# Patient Record
Sex: Female | Born: 1975 | Race: White | Hispanic: No | Marital: Married | Smoking: Never smoker
Health system: Southern US, Community
[De-identification: ages and names within clinical notes are randomized; demographics above are authoritative.]

## PROBLEM LIST (undated history)

## (undated) DIAGNOSIS — N979 Female infertility, unspecified: Secondary | ICD-10-CM

## (undated) DIAGNOSIS — N809 Endometriosis, unspecified: Secondary | ICD-10-CM

## (undated) DIAGNOSIS — M797 Fibromyalgia: Secondary | ICD-10-CM

## (undated) HISTORY — PX: WISDOM TOOTH EXTRACTION: SHX21

## (undated) HISTORY — PX: LAPAROSCOPY: SHX197

## (undated) HISTORY — DX: Endometriosis, unspecified: N80.9

## (undated) HISTORY — DX: Fibromyalgia: M79.7

## (undated) HISTORY — DX: Female infertility, unspecified: N97.9

---

## 1999-10-18 ENCOUNTER — Other Ambulatory Visit: Admission: RE | Admit: 1999-10-18 | Discharge: 1999-10-18 | Payer: Self-pay | Admitting: Gynecology

## 2000-04-24 ENCOUNTER — Ambulatory Visit (HOSPITAL_COMMUNITY): Admission: RE | Admit: 2000-04-24 | Discharge: 2000-04-24 | Payer: Self-pay | Admitting: Obstetrics and Gynecology

## 2000-04-24 ENCOUNTER — Encounter: Payer: Self-pay | Admitting: Obstetrics and Gynecology

## 2000-08-11 ENCOUNTER — Inpatient Hospital Stay (HOSPITAL_COMMUNITY): Admission: AD | Admit: 2000-08-11 | Discharge: 2000-08-11 | Payer: Self-pay | Admitting: Obstetrics and Gynecology

## 2000-09-24 ENCOUNTER — Inpatient Hospital Stay (HOSPITAL_COMMUNITY): Admission: AD | Admit: 2000-09-24 | Discharge: 2000-09-27 | Payer: Self-pay | Admitting: Obstetrics and Gynecology

## 2000-11-28 ENCOUNTER — Other Ambulatory Visit: Admission: RE | Admit: 2000-11-28 | Discharge: 2000-11-28 | Payer: Self-pay | Admitting: Gynecology

## 2002-07-16 ENCOUNTER — Other Ambulatory Visit: Admission: RE | Admit: 2002-07-16 | Discharge: 2002-07-16 | Payer: Self-pay | Admitting: Obstetrics and Gynecology

## 2003-07-21 ENCOUNTER — Other Ambulatory Visit: Admission: RE | Admit: 2003-07-21 | Discharge: 2003-07-21 | Payer: Self-pay | Admitting: Obstetrics and Gynecology

## 2007-02-12 ENCOUNTER — Ambulatory Visit (HOSPITAL_COMMUNITY): Admission: RE | Admit: 2007-02-12 | Discharge: 2007-02-12 | Payer: Self-pay | Admitting: Obstetrics and Gynecology

## 2008-05-15 ENCOUNTER — Encounter: Admission: RE | Admit: 2008-05-15 | Discharge: 2008-05-15 | Payer: Self-pay | Admitting: Otolaryngology

## 2008-08-01 ENCOUNTER — Ambulatory Visit (HOSPITAL_COMMUNITY): Admission: RE | Admit: 2008-08-01 | Discharge: 2008-08-01 | Payer: Self-pay | Admitting: Otolaryngology

## 2008-08-01 ENCOUNTER — Encounter (INDEPENDENT_AMBULATORY_CARE_PROVIDER_SITE_OTHER): Payer: Self-pay | Admitting: Otolaryngology

## 2010-09-21 LAB — BASIC METABOLIC PANEL
BUN: 10 mg/dL (ref 6–23)
CO2: 26 mEq/L (ref 19–32)
Calcium: 9.7 mg/dL (ref 8.4–10.5)
Chloride: 108 mEq/L (ref 96–112)
Creatinine, Ser: 0.77 mg/dL (ref 0.4–1.2)
GFR calc Af Amer: >60 mL/min (ref >60)
GFR calc non Af Amer: >60 mL/min (ref >60)
Glucose, Bld: 89 mg/dL (ref 70–99)
Potassium: 4.7 mEq/L (ref 3.5–5.1)
Sodium: 138 mEq/L (ref 135–145)

## 2010-09-21 LAB — CBC
HCT: 38.3 % (ref 36.0–46.0)
Hemoglobin: 13.6 g/dL (ref 12.0–15.0)
MCHC: 35.5 g/dL (ref 30.0–36.0)
MCV: 90.8 fL (ref 78.0–100.0)
Platelets: 221 10*3/uL (ref 150–400)
RBC: 4.22 MIL/uL (ref 3.87–5.11)
RDW: 12.9 % (ref 11.5–15.5)
WBC: 4.5 10*3/uL (ref 4.0–10.5)

## 2010-09-21 LAB — HCG, SERUM, QUALITATIVE: Preg, Serum: NEGATIVE

## 2010-10-19 NOTE — Op Note (Signed)
Samantha Barron, Samantha Barron                 ACCOUNT NO.:  [id number]   MEDICAL RECORD NO.:  [id number]          PATIENT TYPE:  AMB   LOCATION:  SDS                          FACILITY:  [facility]   PHYSICIAN:  Kinnie Scales. Annalee Genta, M.D.DATE OF BIRTH:  1975/10/27   DATE OF PROCEDURE:  08/01/2008  DATE OF DISCHARGE:  08/01/2008                               OPERATIVE REPORT   PREOPERATIVE DIAGNOSIS:  Bilateral preauricular fistulae.   POSTOPERATIVE DIAGNOSIS:  Bilateral preauricular fistulae.   INDICATIONS FOR SURGERY:  Bilateral preauricular fistulae.   SURGICAL PROCEDURES:  Exploration, excision, and reconstruction of  bilateral preauricular fistulae.   SURGEON:  Kinnie Scales. Annalee Genta, MD   ANESTHESIA:  General.   COMPLICATIONS:  None.   BLOOD LOSS:  Minimal.   The patient is transferred from the operating room to the recovery room  in stable condition.   BRIEF HISTORY:  The patient is a 35 year old white female, who has had a  history of congenital bilateral preauricular pits.  On the patient's  left-hand side, she has had chronic symptoms of drainage and over the  last 6 months, she has had increasing size of the underlying skin mass  with pain and erythema.  She was seen in our office and treated for  acute infection with several courses of antibiotics.  A CT scan was  obtained of the facial soft tissue and temporal bones, and the patient  had normal-appearing middle ear and ear canal development.  Examination  was normal with the exception on the left-hand side, a small skin pit  with approximately 7-mm firm mass in the deep subcutaneous tissue  anterior to the preauricular sulcus.  On the patient's right-hand side,  there was a small skin pit, but no palpable mass and no drainage or  discharge.  Given the patient's history and physical examination and CT  findings, I recommended to undertake excisional biopsy of the cysts  bilaterally.  The risks, benefits, and possible complications  of  procedure were discussed in detail, and the patient understood and  concurred with our plan for surgery.   PROCEDURE:  The patient was brought to the operating room at [facility] OR on August 01, 2008, and placed in supine position on  the operating table.  General LMA anesthesia was established without  difficulty.  When the patient was adequately anesthetized, she was  positioned on the operating table, and prepped and draped in sterile  fashion.  The proposed skin incisions were injected with a total of 3 mL  of 1% lidocaine with 1:100,000 solution epinephrine injected in  subcutaneous fashion in the preauricular crease bilaterally.  After  allowing adequate time for vasoconstriction hemostasis, the procedure  was begun on the patient's left-hand side.  She was prepped and draped.  A 3-cm curvilinear incision was created in the preauricular crease.  This was an elliptical incision, which included the skin pit and  surrounding soft tissues.  Incision was created with a #15 scalpel and  the skin was divided.  The central component including the skin pit was  then excised and  tracked medially.  The preauricular cartilages were  carefully dissected and preserved.  Soft tissue was reflected  anteriorly.  A cystic mass was identified and then tracked from  superficial to deep, total dissection measured approximately 1.5 cm and  a large approximately 1 cm cyst was resected from the preauricular  sulcus.  There was no evidence of communication with the ear canal and  the entire soft tissue cyst was resected without evidence of spillage of  the cyst contents.  The left preauricular cyst was then sent to  pathology for gross microscopic evaluation.  The wound was thoroughly  irrigated and then closed in multiple layers, deep soft tissue  approximation with 5-0 Vicryl suture, soft tissue was advanced and  rotated into position, and the superficial and subcutaneous layers  were  closed with interrupted 5-0 Vicryl.  Final skin closure with 6-0  Ethilon, and the wound was then dressed with bacitracin ointment.   On the right-hand side, procedure was then carried out, which was  similar in nature.  She was positioned, prepped and draped in a sterile  fashion, and an elliptical incision was created around the right skin  pit.  Although there was no significant palpable mass superficially,  dissection was carried out and extended approximately 2 cm in length  from the skin along the superior aspect of the ear canal.  There was no  evidence of direct connection with the ear canal, but a large thin  cystic mass was dissected carefully of the surrounding tissues.  At the  conclusion of procedure, the area was carefully palpated and there was  no evidence of residual cyst or a cystic material.  All tissue was sent  to Pathology for microscopic evaluation.  The wound was then irrigated  and closed in multiple layers beginning with deep reconstruction with a  5-0 Vicryl suture.  Soft tissue anteriorly was then rotated into  position and closed with multiple 5-0 Vicryl sutures.  Deep subcutaneous  tissue was closed with the same stitch and final skin edge closed with 6-  0 Ethilon in an interrupted fashion.  The patient's wound was then  dressed with bacitracin ointment.  She was awakened from anesthetic and  transferred from the operating room to recovery room in stable  condition.  No complications.  Blood loss minimal.           ______________________________  Kinnie Scales. Annalee Genta, M.D.     DLS/MEDQ  D:  81/19/1478  T:  08/01/2008  Job:  [id number]

## 2010-10-19 NOTE — Op Note (Signed)
NAMEJACELYNN, Samantha Barron                 ACCOUNT NO.:  [id number]   MEDICAL RECORD NO.:  [id number]          PATIENT TYPE:  AMB   LOCATION:  SDC                           FACILITY:  [facility]   PHYSICIAN:  Dois Davenport A. Rivard, M.D. DATE OF BIRTH:  03-24-1976   DATE OF PROCEDURE:  02/12/2007  DATE OF DISCHARGE:                               OPERATIVE REPORT   PREOPERATIVE DIAGNOSIS:  Dyspareunia with perineal band   POSTOPERATIVE DIAGNOSIS:  Dyspareunia with perineal band   ANESTHESIA:  IV sedation and local.   PROCEDURE:  Perineoplasty.   SURGEON:  Crist Fat. Rivard, M.D.   ASSISTANTMarquis Lunch. Lowell Guitar, P.A.   DESCRIPTION OF PROCEDURE:  After being informed of the planned procedure  with possible complications; including bleeding, infection, and scar  tissue informed consent is obtained.  The patient is taken to OR #8 and  given IV sedation and placed in the lithotomy position.  She is prepped  and draped in a sterile fashion and her bladder is emptied with an in-  and-out Foley catheter.   GYN exam reveals a perineal band from a previous site of perineal tear  as well as part of the high mantle band being sutured to the vestibule.  The patient is very comfortable under IV sedation but we performed local  infiltration of the perineum using 7 mL of Marcaine 0.25.   We initially proceeded with excision of that high mantle remnant using  sharp scissors.  Then the perineal skin is incised vertically,  undermined until it is easily mobilized without affecting the perineal  muscle; and we then perform paradoxical closure with 4-0 Monocryl after  assessing good hemostasis with cauterization.  At the end of the  procedure the perineum has a normal elliptical shape.  No bandwidth  traction and, of course, the high mantle remnant is now removed.   Instruments and sponge count is complete x2.  Estimated blood loss is  minimal.  The procedure is very well tolerated by the patient who is  taken to  recovery room, and then discharged home in a well and stable  condition.      Crist Fat Rivard, M.D.  Electronically Signed     SAR/MEDQ  D:  02/12/2007  T:  02/13/2007  Job:  [id number]
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
## 2011-03-18 LAB — CBC
HCT: 39.1
Hemoglobin: 13.8
MCV: 91.2
Platelets: 236
RDW: 12.6

## 2011-10-28 ENCOUNTER — Emergency Department (HOSPITAL_COMMUNITY): Payer: No Typology Code available for payment source

## 2011-10-28 ENCOUNTER — Encounter (HOSPITAL_COMMUNITY): Payer: Self-pay | Admitting: *Deleted

## 2011-10-28 ENCOUNTER — Emergency Department (HOSPITAL_COMMUNITY)
Admission: EM | Admit: 2011-10-28 | Discharge: 2011-10-28 | Disposition: A | Discharge status: Home / Self Care | Payer: No Typology Code available for payment source | Attending: Emergency Medicine | Admitting: Emergency Medicine

## 2011-10-28 DIAGNOSIS — Y92410 Unspecified street and highway as the place of occurrence of the external cause: Secondary | ICD-10-CM | POA: Insufficient documentation

## 2011-10-28 DIAGNOSIS — IMO0002 Reserved for concepts with insufficient information to code with codable children: Secondary | ICD-10-CM | POA: Insufficient documentation

## 2011-10-28 DIAGNOSIS — Y998 Other external cause status: Secondary | ICD-10-CM | POA: Insufficient documentation

## 2011-10-28 DIAGNOSIS — M542 Cervicalgia: Secondary | ICD-10-CM | POA: Insufficient documentation

## 2011-10-28 DIAGNOSIS — Z79899 Other long term (current) drug therapy: Secondary | ICD-10-CM | POA: Insufficient documentation

## 2011-10-28 DIAGNOSIS — T148XXA Other injury of unspecified body region, initial encounter: Secondary | ICD-10-CM

## 2011-10-28 DIAGNOSIS — M546 Pain in thoracic spine: Secondary | ICD-10-CM | POA: Insufficient documentation

## 2011-10-28 DIAGNOSIS — R1032 Left lower quadrant pain: Secondary | ICD-10-CM | POA: Insufficient documentation

## 2011-10-28 DIAGNOSIS — M545 Low back pain, unspecified: Secondary | ICD-10-CM | POA: Insufficient documentation

## 2011-10-28 MED ORDER — ACETAMINOPHEN-CODEINE #3 300-30 MG PO TABS: 1.0000 | ORAL_TABLET | Freq: Four times a day (QID) | ORAL | Status: DC | PRN

## 2011-10-28 MED ORDER — ACETAMINOPHEN-CODEINE 120-12 MG/5ML PO SOLN
10.0000 mL | Freq: Once | ORAL | Status: AC
  Administered 2011-10-28: 6 mL via ORAL
  Filled 2011-10-28: qty 10

## 2011-10-28 MED ORDER — CYCLOBENZAPRINE HCL 10 MG PO TABS: 10.0000 mg | ORAL_TABLET | Freq: Two times a day (BID) | ORAL | Status: AC | PRN

## 2011-10-28 MED ORDER — FENTANYL CITRATE 0.05 MG/ML IJ SOLN
12.5000 ug | Freq: Once | INTRAMUSCULAR | Status: DC
  Filled 2011-10-28: qty 2

## 2011-10-28 MED ORDER — CYCLOBENZAPRINE HCL 10 MG PO TABS: 10.0000 mg | ORAL_TABLET | Freq: Two times a day (BID) | ORAL | Status: DC | PRN

## 2011-10-28 MED ORDER — ACETAMINOPHEN-CODEINE 120-12 MG/5ML PO SOLN: 5.0000 mL | Freq: Four times a day (QID) | ORAL | Status: AC | PRN

## 2011-10-28 MED ORDER — IOHEXOL 300 MG/ML  SOLN
80.0000 mL | Freq: Once | INTRAMUSCULAR | Status: AC | PRN
  Administered 2011-10-28: 80 mL via INTRAVENOUS

## 2011-10-28 MED ORDER — ACETAMINOPHEN-CODEINE #3 300-30 MG PO TABS: 2.0000 | ORAL_TABLET | Freq: Once | ORAL | Status: DC
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
## 2011-10-28 NOTE — ED Notes (Signed)
Patient transported to X-ray 

## 2011-10-28 NOTE — ED Notes (Signed)
Pt returned from radiology.
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
## 2011-10-28 NOTE — ED Provider Notes (Signed)
Medical screening examination/treatment/procedure(s) were performed by non-physician practitioner and as supervising physician I was immediately available for consultation/collaboration.   Carleene Cooper III, MD 10/28/11 2059

## 2011-10-28 NOTE — ED Notes (Signed)
Patient states allergic to a lot of medications and asked the nurse to ask Doctor if medication is similar to any of the medication she is allergic to. Resting comfortably on stretcher currently does not want blanket placed blanket on patient's side if needed. Family member at beside.

## 2012-06-13 ENCOUNTER — Ambulatory Visit: Payer: Self-pay | Admitting: Obstetrics and Gynecology

## 2012-06-19 ENCOUNTER — Telehealth: Payer: Self-pay

## 2012-06-19 MED ORDER — NORETHINDRONE-ETH ESTRADIOL 0.4-35 MG-MCG PO TABS: 1.0000 | ORAL_TABLET | Freq: Every day | ORAL | Status: DC

## 2012-06-19 NOTE — Telephone Encounter (Signed)
Rx refill request Balziva 28 tablet #84 take 1 tab po daily continuously. [facility]  Advised pt that rx would be called into pharmacy to last until aex scheduled for 07/04/12 with SR  Darien Ramus, CMA

## 2012-07-04 ENCOUNTER — Ambulatory Visit: Payer: Managed Care, Other (non HMO) | Admitting: Obstetrics and Gynecology

## 2012-07-04 ENCOUNTER — Encounter: Payer: Self-pay | Admitting: Obstetrics and Gynecology

## 2012-07-04 VITALS — BP 102/60 | Ht 63.5 in | Wt 152.0 lb

## 2012-07-04 DIAGNOSIS — Z01419 Encounter for gynecological examination (general) (routine) without abnormal findings: Secondary | ICD-10-CM

## 2012-07-04 DIAGNOSIS — Z124 Encounter for screening for malignant neoplasm of cervix: Secondary | ICD-10-CM

## 2012-07-04 DIAGNOSIS — N979 Female infertility, unspecified: Secondary | ICD-10-CM | POA: Insufficient documentation

## 2012-07-04 DIAGNOSIS — M797 Fibromyalgia: Secondary | ICD-10-CM | POA: Insufficient documentation

## 2012-07-04 DIAGNOSIS — N809 Endometriosis, unspecified: Secondary | ICD-10-CM | POA: Insufficient documentation

## 2012-07-04 MED ORDER — NORETHINDRONE-ETH ESTRADIOL 0.4-35 MG-MCG PO TABS: 1.0000 | ORAL_TABLET | Freq: Every day | ORAL | Status: AC

## 2012-07-04 NOTE — Progress Notes (Signed)
The patient reports:no complaints   Contraception:BCP Ovcon 35  Last mammogram: n/a Last pap:06/06/2011 Normal  GC/Chlamydia cultures offered: declined HIV/RPR/HbsAg offered:  declined HSV 1 and 2 glycoprotein offered: declined  Menstrual cycle regular and monthly: No Menstrual flow normal: No:   Urinary symptoms: none Normal bowel movements: Yes Reports abuse at home: No:   Subjective:    Samantha Barron is a 37 y.o. female, G1P1, who presents for an annual exam.     History   Social History  . Marital Status: Married    Spouse Name: N/A    Number of Children: N/A  . Years of Education: N/A   Social History Main Topics  . Smoking status: Never Smoker   . Smokeless tobacco: None  . Alcohol Use: No  . Drug Use: No  . Sexually Active: Yes    Birth Control/ Protection: Pill   Other Topics Concern  . None   Social History Narrative  . None    Menstrual cycle:   LMP: No LMP recorded. Patient is not currently having periods (Reason: Other).           Cycle: normal  The following portions of the patient's history were reviewed and updated as appropriate: allergies, current medications, past family history, past medical history, past social history, past surgical history and problem list.  Review of Systems Pertinent items are noted in HPI. Breast:Negative for breast lump,nipple discharge or nipple retraction Gastrointestinal: Negative for abdominal pain, change in bowel habits or rectal bleeding Urinary:negative   Objective:    BP 102/60  Ht 5' 3.5" (1.613 m)  Wt 152 lb (68.947 kg)  BMI 26.50 kg/m2    Weight:  Wt Readings from Last 1 Encounters:  07/04/12 152 lb (68.947 kg)          BMI: Body mass index is 26.50 kg/(m^2).  General Appearance: Alert, appropriate appearance for age. No acute distress HEENT: Grossly normal Neck / Thyroid: Supple, no masses, nodes or enlargement Lungs: clear to auscultation bilaterally Back: No CVA tenderness Breast Exam: No  masses or nodes.No dimpling, nipple retraction or discharge. Cardiovascular: Regular rate and rhythm. S1, S2, no murmur Gastrointestinal: Soft, non-tender, no masses or organomegaly Pelvic Exam: Vulva and vagina appear normal. Bimanual exam reveals normal uterus and adnexa. Rectovaginal: not indicated Lymphatic Exam: Non-palpable nodes in neck, clavicular, axillary, or inguinal regions  Skin: no rash or abnormalities Neurologic: Normal gait and speech, no tremor  Psychiatric: Alert and oriented, appropriate affect.    Assessment:    Normal gyn exam    Plan:    pap smear return annually or prn STD screening: declined Contraception:oral contraceptives (estrogen/progesterone) Ovcon 35   Silverio Lay MD
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
## 2013-03-15 ENCOUNTER — Encounter (HOSPITAL_COMMUNITY): Payer: Self-pay | Admitting: Emergency Medicine

## 2013-03-15 ENCOUNTER — Emergency Department (HOSPITAL_COMMUNITY)
Admission: EM | Admit: 2013-03-15 | Discharge: 2013-03-15 | Disposition: A | Discharge status: Home / Self Care | Payer: BC Managed Care – PPO | Attending: Emergency Medicine | Admitting: Emergency Medicine

## 2013-03-15 DIAGNOSIS — Z79899 Other long term (current) drug therapy: Secondary | ICD-10-CM | POA: Insufficient documentation

## 2013-03-15 DIAGNOSIS — Z880 Allergy status to penicillin: Secondary | ICD-10-CM | POA: Insufficient documentation

## 2013-03-15 DIAGNOSIS — Z8742 Personal history of other diseases of the female genital tract: Secondary | ICD-10-CM | POA: Insufficient documentation

## 2013-03-15 DIAGNOSIS — T7840XA Allergy, unspecified, initial encounter: Secondary | ICD-10-CM

## 2013-03-15 DIAGNOSIS — L539 Erythematous condition, unspecified: Secondary | ICD-10-CM | POA: Insufficient documentation

## 2013-03-15 DIAGNOSIS — M79609 Pain in unspecified limb: Secondary | ICD-10-CM | POA: Insufficient documentation

## 2013-03-15 MED ORDER — DIPHENHYDRAMINE HCL 12.5 MG/5ML PO ELIX
25.0000 mg | ORAL_SOLUTION | Freq: Once | ORAL | Status: AC
  Administered 2013-03-15: 25 mg via ORAL
  Filled 2013-03-15: qty 10

## 2013-03-15 MED ORDER — DIPHENHYDRAMINE HCL 25 MG PO CAPS
25.0000 mg | ORAL_CAPSULE | Freq: Once | ORAL | Status: DC
  Filled 2013-03-15: qty 1

## 2013-03-15 MED ORDER — DEXAMETHASONE SODIUM PHOSPHATE 10 MG/ML IJ SOLN
10.0000 mg | Freq: Once | INTRAMUSCULAR | Status: DC
  Filled 2013-03-15: qty 1

## 2013-03-15 NOTE — ED Notes (Signed)
MD at bedside. 

## 2013-03-15 NOTE — ED Notes (Signed)
Pt was doing yard work and felt something bite her RFA, reports immediate burning at site that is now radiating up her entire arm and into her R armpit. Redness and raised bumps at site of bite. Resp e/u

## 2013-03-15 NOTE — ED Notes (Signed)
Pt requested to speak to Dr. About medications prior to taking. States she is allergic to multiple meds and would like to take her home medication.

## 2013-03-15 NOTE — ED Provider Notes (Signed)
CSN: [id number]     Arrival date & time 03/15/13  1129 History   First MD Initiated Contact with Patient 03/15/13 1134     Chief Complaint  Patient presents with  . Insect Bite   (Consider location/radiation/quality/duration/timing/severity/associated sxs/prior Treatment) HPI Patient is a 37 yo female who presents with acute onset right lateral forearm pain and erythema in the setting of clearing brush at home. States about 40 minutes prior to presentation she was clearing brush when she noted a sudden 2 pricks on her right lateral forearm and immediate burning sensation in that arm. She subsequently developed pain into her right arm pit and right hand. By time of presentation the patients arm pit pain had eased off some. Patient and husband note there are copperheads in the area they live, though the could not find anything in the area they were cleaning. They did not see what caused this issue. She denies chest pain, shortness of breath, throat tightness, abdominal pain, nausea, vomiting, and diarrhea.  Past Medical History  Diagnosis Date  . Fibromyalgia   . Endometriosis   . Infertility, female    Past Surgical History  Procedure Laterality Date  . Wisdom tooth extraction    . Laparoscopy     Family History  Problem Relation Age of Onset  . Allergies Mother   . Hypertension Mother   . Hypertension Father   . Cancer Maternal Uncle     polyps  . Mental illness Maternal Grandfather   . Stroke Paternal Grandmother   . Cancer Other     colon   History  Substance Use Topics  . Smoking status: Never Smoker   . Smokeless tobacco: Not on file  . Alcohol Use: No   OB History   Grav Para Term Preterm Abortions TAB SAB Ect Mult Living   1 1        1      Review of Systems per HPI otherwise all other systems were negative.  Allergies  Ibuprofen; Amoxicillin; Aspirin; Ciprofloxacin; Niacin and related; Omeprazole; Promethazine; Sudafed pe; Flonase; and Penicillins  Home  Medications   Current Outpatient Rx  Name  Route  Sig  Dispense  Refill  . magnesium 30 MG tablet   Oral   Take 30 mg by mouth 2 (two) times daily.         . Multiple Vitamin (MULITIVITAMIN WITH MINERALS) TABS   Oral   Take 1 tablet by mouth daily.         . norethindrone-ethinyl estradiol (OVCON-35,BALZIVA,BRIELLYN) 0.4-35 MG-MCG tablet   Oral   Take 1 tablet by mouth daily. Take 1 tablet daily continuous without allowing cycle   3 Package   4   . Omega-3 Fatty Acids (FISH OIL BURP-LESS) 1000 MG CAPS   Oral   Take by mouth.         . Probiotic Product (PROBIOTIC PO)   Oral   Take 1 capsule by mouth daily.         . Turmeric 500 MG CAPS   Oral   Take by mouth.         . vitamin B-12 (CYANOCOBALAMIN) 1000 MCG tablet   Oral   Take 1,000 mcg by mouth daily.         . vitamin C (ASCORBIC ACID) 500 MG tablet   Oral   Take 500 mg by mouth daily.          BP 132/68  Pulse 107  Temp(Src) 97.9 F (36.6 C) (  Oral)  Resp 18  Ht 5\' 4"  (1.626 m)  Wt 152 lb (68.947 kg)  BMI 26.08 kg/m2  SpO2 98% Physical Exam  Constitutional: She appears well-developed and well-nourished.  HENT:  Head: Normocephalic and atraumatic.  Mouth/Throat: Oropharynx is clear and moist.  Eyes: Conjunctivae are normal. Pupils are equal, round, and reactive to light.  Cardiovascular: Normal rate, regular rhythm and normal heart sounds.   Pulmonary/Chest: Effort normal and breath sounds normal.  Musculoskeletal: She exhibits no edema.  Neurological: She is alert.  Decreased grip strength in right hand   Skin: Skin is warm and dry.  Right mid-lateral forearm with area of ~8 cm diameter erythema and tenderness and warmth, dots drawn to outline the lesion, no puncture sites seen on exam    ED Course  Procedures (including critical care time) Labs Review Labs Reviewed - No data to display Imaging Review No results found.  EKG Interpretation   None       MDM   1. Allergic  reaction, initial encounter    11:50 am: patient seen and examined. Notes being stung or bitten by something about 40 minutes ago. No puncture sites seen at site of inflammation and erythema. Most likely is a localized allergic reaction to an insect bite or plant contact. Will monitor in the ED. Patient to receive IM decadron and PO benadryl.  12:15 pm: nursing came to inform MD that patient wanted benadryl elixir and did not want to take decadron shot. Discussed this with the patient and she states she does not want to take the decadron due to not being sure how she will react to this. We will change benadryl to liquid formulation and monitor patient. She is also asking if she would be able to leave soon as her pain is improving.  1:15 pm: reevaluated the patient. Her redness (now to 4-5 cm in length) and pain are improved and she has improved function in her right hand to near baseline. Patient stable for discharge. Discussed that this is likely related to an insect bite vs allergy to a plant in the brush she was clearing. She is to continue benadryl prn as outline on the package insert. Given return precautions. Patient stated understanding of this and is agreeable with discharge. This patient was discussed with my attending Dr Radford Pax.  Marikay Alar, MD [facility] PGY-2 03/15/13 1:38 pm  Glori Luis, MD 03/15/13 1504

## 2013-03-16 NOTE — ED Provider Notes (Signed)
I saw and evaluated the patient, reviewed the resident's note and I agree with the findings and plan.   .Face to face Exam:  General:  Awake HEENT:  Atraumatic Resp:  Normal effort Abd:  Nondistended Neuro:No focal weakness  Nelia Shi, MD 03/16/13 1120

## 2014-04-07 ENCOUNTER — Encounter (HOSPITAL_COMMUNITY): Payer: Self-pay | Admitting: Emergency Medicine

## 2015-08-12 ENCOUNTER — Telehealth: Payer: Self-pay | Admitting: Cardiology

## 2015-08-12 NOTE — Telephone Encounter (Signed)
[facility] on 08/25/15 with Dr Jens Somrenshaw.  [facility] Dr Ludwig Clarksrenshaw's schedule on 08/25/15. lp

## 2015-08-20 NOTE — Progress Notes (Signed)
     HPI: 40 year old female for evaluation of palpitations.  Current Outpatient Prescriptions  Medication Sig Dispense Refill  . magnesium 30 MG tablet Take 30 mg by mouth 2 (two) times daily.    . Multiple Vitamin (MULITIVITAMIN WITH MINERALS) TABS Take 1 tablet by mouth daily.    . norethindrone-ethinyl estradiol (OVCON-35,BALZIVA,BRIELLYN) 0.4-35 MG-MCG tablet Take 1 tablet by mouth daily. Take 1 tablet daily continuous without allowing cycle 3 Package 4  . Omega-3 Fatty Acids (FISH OIL BURP-LESS) 1000 MG CAPS Take 1,000 mg by mouth daily.     . Probiotic Product (PROBIOTIC PO) Take 1 capsule by mouth daily.    . Turmeric 500 MG CAPS Take 500 mg by mouth daily.     . vitamin B-12 (CYANOCOBALAMIN) 1000 MCG tablet Take 1,000 mcg by mouth daily.    . vitamin C (ASCORBIC ACID) 500 MG tablet Take 500 mg by mouth daily.     No current facility-administered medications for this visit.    Allergies  Allergen Reactions  . Ibuprofen Anaphylaxis  . Amoxicillin   . Aspirin   . Ciprofloxacin   . Niacin And Related     burning  . Omeprazole Nausea And Vomiting  . Oxycodone     Takes in low doses  . Promethazine   . Sudafed Pe [Phenylephrine]   . Flonase [Fluticasone Propionate] Rash  . Penicillins Rash    Past Medical History  Diagnosis Date  . Fibromyalgia   . Endometriosis   . Infertility, female     Past Surgical History  Procedure Laterality Date  . Wisdom tooth extraction    . Laparoscopy      Social History   Social History  . Marital Status: Married    Spouse Name: N/A  . Number of Children: N/A  . Years of Education: N/A   Occupational History  . Not on file.   Social History Main Topics  . Smoking status: Never Smoker   . Smokeless tobacco: Not on file  . Alcohol Use: No  . Drug Use: No  . Sexual Activity: Yes    Birth Control/ Protection: Pill   Other Topics Concern  . Not on file   Social History Narrative    Family History  Problem Relation  Age of Onset  . Allergies Mother   . Hypertension Mother   . Hypertension Father   . Cancer Maternal Uncle     polyps  . Mental illness Maternal Grandfather   . Stroke Paternal Grandmother   . Cancer Other     colon    ROS: no fevers or chills, productive cough, hemoptysis, dysphasia, odynophagia, melena, hematochezia, dysuria, hematuria, rash, seizure activity, orthopnea, PND, pedal edema, claudication. Remaining systems are negative.  Physical Exam:   There were no vitals taken for this visit.  General:  Well developed/well nourished in NAD Skin warm/dry Patient not depressed No peripheral clubbing Back-normal HEENT-normal/normal eyelids Neck supple/normal carotid upstroke bilaterally; no bruits; no JVD; no thyromegaly chest - CTA/ normal expansion CV - RRR/normal S1 and S2; no murmurs, rubs or gallops;  PMI nondisplaced Abdomen -NT/ND, no HSM, no mass, + bowel sounds, no bruit 2+ femoral pulses, no bruits Ext-no edema, chords, 2+ DP Neuro-grossly nonfocal  ECG    This encounter was created in error - please disregard.

## 2015-08-25 ENCOUNTER — Encounter: Payer: BC Managed Care – PPO | Admitting: Cardiology
